# Patient Record
Sex: Male | Born: 1960 | Race: White | Hispanic: No | Marital: Married
Health system: Southern US, Community
[De-identification: ages and names within clinical notes are randomized; demographics above are authoritative.]

---

## 2005-12-19 ENCOUNTER — Ambulatory Visit: Payer: Self-pay | Admitting: Podiatry

## 2012-08-07 ENCOUNTER — Ambulatory Visit
Admission: RE | Admit: 2012-08-07 | Discharge: 2012-08-07 | Disposition: A | Discharge status: Home / Self Care | Payer: BC Managed Care – PPO | Source: Ambulatory Visit | Attending: Chiropractic Medicine | Admitting: Chiropractic Medicine

## 2012-08-07 ENCOUNTER — Other Ambulatory Visit: Payer: Self-pay | Admitting: Chiropractic Medicine

## 2012-09-25 ENCOUNTER — Ambulatory Visit: Payer: Self-pay | Admitting: Orthopaedic Surgery

## 2012-10-11 ENCOUNTER — Ambulatory Visit: Payer: Self-pay | Admitting: Anesthesiology

## 2012-10-24 ENCOUNTER — Ambulatory Visit: Payer: Self-pay | Admitting: Anesthesiology

## 2012-10-31 ENCOUNTER — Ambulatory Visit: Payer: Self-pay | Admitting: Anesthesiology

## 2013-04-14 ENCOUNTER — Ambulatory Visit: Payer: Self-pay | Admitting: Unknown Physician Specialty

## 2014-09-11 NOTE — H&P (Signed)
PATIENT NAME:  Dan Duran, Dan Duran MR#:  [id number] DATE OF BIRTH:  05-14-1961  DATE OF ADMISSION:  10/11/2012  DATE OF DICTATION: 10/11/2012.   CHIEF COMPLAINT: Left-sided neck pain with radiation into the left thumb and index finger.   PROCEDURE: None.   HISTORY OF PRESENT ILLNESS: The patient is a pleasant, 54 year old, white male with a long-standing history of neck pain and left shoulder and arm pain. This has been present for greater than 2 months and has gradually intensified to the point where it is a maximum VAS of seven, best a 0, and averaging between a 4 to 5. Seems to be worse in the morning when he initially gets up from bed and does bother him with certain activities. Prolonged sitting seems to aggravate his pain. Alleviating factors include some physical therapy, which he has just initiated, previous chiropractic manipulation although short lived as well as medication management. Currently, he is taking Advil for breakthrough pain typically taking 3 tablets at 200 mg strength on a t.i.d. basis p.r.n. He has associated tingling that radiates into the left thumb and index finger and the pain does not let him sleep well at night. He describes it as aching, agonizing and annoying with associated tingling. A previous MRI scan was taken and did show evidence of  cervical degenerative disk disease most notably at C5-C6 with mild to moderate posterior disk bulge that caused defacement of the CSF space with severe left and moderate right neural foraminal narrowing. At C6-C7, there is a mild posterior bulge with some mild effacement and this was read by Dr. Bunnie Philips, dated 05/07.   PAST MEDICAL HISTORY: Significant for negative cardiac, negative pulmonary, negative neurologic other than previously mentioned, negative psychologic, negative GI, negative GU, negative hematologic.   SOCIAL HISTORY: He is married with two children. Has never smoked. Works full-time at [employer].    ALLERGIES:  None.  CURRENT MEDICATIONS:  Include p.r.n. Advil.  PAST SURGICAL HISTORY: Tonsillectomy.   PHYSICAL EXAMINATION:  Reveals a pleasant white male in no acute distress. Alert and oriented x 3, cooperative and compliant.   HEENT:  Noncontributory with pupils equally round and reactive to light. Extraocular muscles are intact.   HEART:  Regular rate and rhythm.   LUNGS: Clear to auscultation bilaterally.   VITAL SIGNS:  Blood pressure 122/71, pulse 63, respirations 15, oxygen saturation 96%. Visual analog scale 7/10. With the patient in the standing position, he has got good range of motion at the Atlanto-occipital joint with forward flexion and extension without significant difficulty. Lateral rotation is intact with compression over the head. This does not reproduce his pain. He is able to extend at the neck without immediate reproduction of pain, though 15 seconds later, he did have exacerbation of his left thumb and index finger sensation. His strength appears to be intact to both proximal and distal upper extremity function.  Biceps, triceps, appear to be intact. I would rate his biceps strength as 5-/5 as compared to the right side. Hand grasp and lumbrical strength appears to be intact as does flexors and extensors at the wrist.   ASSESSMENT: 1. C6 radiculitis, left hand with cervicalgia.  2. Myofascial neck pain. 3. Also noted on examination, there was a small trigger point in the left splenius capitis muscle.   PLAN:  Before proceeding with interventional therapy, I would like the patient to continue with his physical therapy regimen for another 2 weeks and in the meantime, I am going to try him  on a steroid Dosepak. I will have him return to the clinic in approximately 2 weeks for possible cervical epidural steroid injection at that time. We talked about the risks, benefits of the procedure in full detail and all questions are  answered.  ____________________________ Currie ParisJames G. Pernell DupreAdams, MD jga:rw D: 10/16/2012 17:22:00 ET T: 10/16/2012 19:20:01 ET JOB#: [id number]  cc: TED ARMOUR, Faythe [place], [place] Currie ParisJames G. Pernell DupreAdams, MD, <Dictator>  Currie ParisJAMES G ADAMS MD ELECTRONICALLY SIGNED 10/22/2012 21:35

## 2016-04-11 ENCOUNTER — Ambulatory Visit: Payer: BLUE CROSS/BLUE SHIELD | Attending: Orthopedic Surgery | Admitting: Occupational Therapy

## 2016-04-11 DIAGNOSIS — M6281 Muscle weakness (generalized): Secondary | ICD-10-CM | POA: Diagnosis present

## 2016-04-11 DIAGNOSIS — M79644 Pain in right finger(s): Secondary | ICD-10-CM | POA: Diagnosis present

## 2016-04-11 DIAGNOSIS — R600 Localized edema: Secondary | ICD-10-CM | POA: Diagnosis present

## 2016-04-11 DIAGNOSIS — M25641 Stiffness of right hand, not elsewhere classified: Secondary | ICD-10-CM

## 2016-04-11 NOTE — Patient Instructions (Signed)
Heat  Blocked AROM for DIP   tendon glides   block intrinsic Full fist - pencil in palm to block MC at 90 - and align 5th from not crossing over  Ice at needed   compression sleeve night and day - breath several times during day

## 2016-04-11 NOTE — Therapy (Signed)
[facility] [address] [place], [place], [address] Phone: [phone]   Fax:  [phone]  Occupational Therapy Evaluation  Patient Details  Name: Dan DawsonRobert B Angelini MRN: [id number] Date of Birth: 04/09/1961 Referring Provider: Martha ClanKrasinski  Encounter Date: 04/11/2016      OT End of Session - 04/11/16 1010    Visit Number 1   Number of Visits 6   Date for OT Re-Evaluation 05/23/16   OT Start Time 0855   OT Stop Time 0940   OT Time Calculation (min) 45 min   Activity Tolerance Patient tolerated treatment well   Behavior During Therapy [facility] for tasks assessed/performed      No past medical history on file.  No past surgical history on file.  There were no vitals filed for this visit.      Subjective Assessment - 04/11/16 1004    Subjective  I fell and dislocated my finger on 10/21 while [place] - reduced it and then it come out 2 more times and the next am - seen Dr Kirtland BouchardK - and wearing buddy strap but swelling still bad and not able to bend at the middle joint   Patient Stated Goals Want to get the use of my pinkie back so I can grip , hold , pick up , gym , write , use computer better    Currently in Pain? No/denies           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[employer] , R hand dominant - likes to gym , hike , mountain sports, yard work , some cooking      Edema   Edema proximal phalanges increase by .7 cm; and PIP 1.3 cm      Right Hand AROM   R Little  MCP 0-90 85 Degrees   R Little PIP 0-100 65 Degrees   R Little DIP 0-70 45 Degrees     Left Hand AROM   L Little DIP 0-70 70 Degrees       fluidotherapy done - and AROM to digits - tendon glides - prior to review of HEP   flexion increase to 80 at  PIP                   OT Education - 04/11/16 1010    Education provided Yes   Education Details HEP and findings discuss    Person(s) Educated Patient   Methods Explanation;Demonstration;Tactile cues;Verbal cues;Handout   Comprehension Verbal cues required;Returned demonstration;Verbalized understanding          OT Short Term Goals - 04/11/16 1014      OT SHORT TERM GOAL #1   Title Edema decrease in R 5th digit by more than 1cm to show increase flexion and of 5th during gripping activities    Baseline PIP flexion 65 , edema more than 1.3 cm    Time 2   Period Weeks   Status New     OT SHORT TERM GOAL #2   Title R 5th AROM improve in PIP and DIP for pt to touch palm during gripping of 1inch objects in ADL's and IADL's    Baseline DIP 45 , PIP 65    Time  4   Period Weeks   Status New           OT Long Term Goals - 04/11/16 1016      OT LONG TERM GOAL #1   Title Grip strength in R hand increase to more than 50% compare to L hand    Baseline Grip NT - 4 1/2 wks out    Time 6   Period Weeks   Status New     OT LONG TERM GOAL #2   Title Flexion and grip in R 5th improve for pt to increase function of R hand  on PRHWE by 10 points    Baseline Pt hard time working out , writing , cutting with knife, gripping - PRWHE function score 12.5/50    Time 6   Period Weeks   Status New               Plan - 04/11/16 1011    Clinical Impression Statement Pt present about 4 1/2 wks out from dislocation of R 5th PIP - pt arrive with 2 budddy straps on - pt show increase edema , pain -  decrease flexion at PIP and DIP - and decrease grip and use of R hand in ADL's and IADL's    Rehab Potential Good   OT Frequency 1x / week   OT Duration 6 weeks   OT Treatment/Interventions Self-care/ADL training;Fluidtherapy;Splinting;Patient/family education;Therapeutic exercises;Ultrasound;Passive range of motion;Manual Therapy;Cryotherapy   Plan assess progress in ROM and  edema    OT Home Exercise Plan see pt instruction    Consulted and Agree with Plan of Care Patient      Patient will benefit from skilled therapeutic intervention in order to improve the following deficits and impairments:  Impaired flexibility, Decreased range of motion, Increased edema, Pain, Impaired UE functional use, Decreased strength, Decreased knowledge of precautions  Visit Diagnosis: Stiffness of right hand, not elsewhere classified - Plan: Ot plan of care cert/re-cert  Pain in right finger(s) - Plan: Ot plan of care cert/re-cert  Localized edema - Plan: Ot plan of care cert/re-cert  Muscle weakness (generalized) - Plan: Ot plan of care cert/re-cert    Problem List There are no active problems to display for this patient.   [name], Asencion Loveday OTR/L,CLT 04/11/2016, 10:23 AM  [facility] [address] [place], [place], [address] Phone: [phone]   Fax:  [phone]  Name: Dan DawsonRobert B Bare MRN: [id number] Date of Birth: 06/23/1960

## 2016-04-18 ENCOUNTER — Ambulatory Visit: Payer: BLUE CROSS/BLUE SHIELD | Admitting: Occupational Therapy

## 2016-04-18 DIAGNOSIS — R600 Localized edema: Secondary | ICD-10-CM

## 2016-04-18 DIAGNOSIS — M25641 Stiffness of right hand, not elsewhere classified: Secondary | ICD-10-CM | POA: Diagnosis not present

## 2016-04-18 DIAGNOSIS — M79644 Pain in right finger(s): Secondary | ICD-10-CM

## 2016-04-18 DIAGNOSIS — M6281 Muscle weakness (generalized): Secondary | ICD-10-CM

## 2016-04-18 NOTE — Therapy (Signed)
[facility] [address] [place], [place], [address] Phone: [phone]   Fax:  [phone]  Occupational Therapy Treatment  Patient Details  Name: Dan DawsonRobert B Duran MRN: [id number] Date of Birth: 10/13/1960 Referring Provider: Martha ClanKrasinski  Encounter Date: 04/18/2016      OT End of Session - 04/18/16 1816    Visit Number 2   Number of Visits 6   Date for OT Re-Evaluation 05/23/16   OT Start Time 1246   OT Stop Time 1325   OT Time Calculation (min) 39 min   Activity Tolerance Patient tolerated treatment well   Behavior During Therapy [facility] for tasks assessed/performed      No past medical history on file.  No past surgical history on file.  There were no vitals filed for this visit.      Subjective Assessment - 04/18/16 1259    Subjective  Just feel like that middle knuckle not bending good - but other 2 better - only time pain when shaking hands or picking up heavy objects - or like when you bended it    Patient Stated Goals Want to get the use of my pinkie back so I can grip , hold , pick up , gym , write , use computer better    Currently in Pain? Yes   Pain Score 1    Pain Location Finger (Comment which one)   Pain Orientation Right   Pain Descriptors / Indicators Aching                      OT Treatments/Exercises (OP) - 04/18/16 0001      RUE Fluidotherapy   Number Minutes Fluidotherapy 10 Minutes   RUE Fluidotherapy Location Hand;Wrist   [facility] ROM at PIP , MP and DIP - squeezing foam block at end       Come in pt about 65 degrees at PIP  [facility] Ed[facility] decrease to about 0.6 at PIP   After fluido  PROM to DIP , PIP 10 reps each Intrinsic stretch to DIP /PIP flexion  Hold 30 sec x 3  PROM composite flexion  Blocked AROM intrinsic fist Full fist place and hold - touching palm  Needed mod A and mod v/c Flexion at PIP improve to 85 during session    Fitted with new compression silicon sleeve to wear most all the time  Buddy strap on during day on and off              OT Education - 04/18/16 1816    Education provided Yes   Education Details add PROM to HEP - updated    Person(s) Educated Patient   Methods Explanation;Demonstration;Tactile cues;Verbal cues;Handout   Comprehension Verbal cues required;Returned demonstration;Verbalized understanding          OT Short Term Goals - 04/11/16 1014      OT SHORT TERM GOAL #1   Title Edema decrease in R 5th digit by more than 1cm to show increase flexion and of 5th during gripping activities    Baseline PIP flexion 65 , edema more than 1.3 cm    Time 2   Period Weeks   Status New     OT SHORT TERM GOAL #2   Title R 5th AROM improve in PIP and DIP for pt to touch palm during gripping of 1inch objects in ADL's and IADL's    Baseline DIP 45 ,  PIP 65    Time 4   Period Weeks   Status New           OT Long Term Goals - 04/11/16 1016      OT LONG TERM GOAL #1   Title Grip strength in R hand increase to more than 50% compare to L hand    Baseline Grip NT - 4 1/2 wks out    Time 6   Period Weeks   Status New     OT LONG TERM GOAL #2   Title Flexion and grip in R 5th improve for pt to increase function of R hand  on PRHWE by 10 points    Baseline Pt hard time working out , writing , cutting with knife, gripping - PRWHE function score 12.5/50    Time 6   Period Weeks   Status New               Plan - 04/18/16 1817    Clinical Impression Statement Pt showe great progress at PIP flexion during session - did add to HEP PROM this date - pt to do HEP for week - if have cancellation would bring pt in - but can do strengthening next week    Rehab Potential Good   OT Frequency 1x / week   OT Duration 4 weeks   OT Treatment/Interventions Self-care/ADL training;Fluidtherapy;Splinting;Patient/family education;Therapeutic exercises;Ultrasound;Passive range of  motion;Manual Therapy;Cryotherapy   Plan assess progress with PROM during HEP    OT Home Exercise Plan see pt instruction    Consulted and Agree with Plan of Care Patient      Patient will benefit from skilled therapeutic intervention in order to improve the following deficits and impairments:  Impaired flexibility, Decreased range of motion, Increased edema, Pain, Impaired UE functional use, Decreased strength, Decreased knowledge of precautions  Visit Diagnosis: Stiffness of right hand, not elsewhere classified  Pain in right finger(s)  Localized edema  Muscle weakness (generalized)    Problem List There are no active problems to display for this patient.   [name], Malynda Smolinski OTR/L,CLT 04/18/2016, 6:18 PM  [place] [facility] [address] , [place], [address] Phone: [phone]   Fax:  [phone]  Name: Dan DawsonRobert B Duran MRN: [id number] Date of Birth: 06/13/1960

## 2016-04-18 NOTE — Patient Instructions (Signed)
Heat   upgrade HEP to  PROM to DIP , PIP 10 reps each Intrinsic stretch to DIP /PIP flexion  Hold 30 sec x 3  PROM composite flexion  Blocked AROM intrinsic fist Full fist place and hold - touching palm  Needed mod A and mod v/c  Fitted with new compression silicon sleeve to wear most all the time  Buddy strap on during day on and off

## 2016-04-24 ENCOUNTER — Ambulatory Visit: Payer: BLUE CROSS/BLUE SHIELD | Attending: Orthopedic Surgery | Admitting: Occupational Therapy

## 2016-04-24 DIAGNOSIS — R600 Localized edema: Secondary | ICD-10-CM | POA: Insufficient documentation

## 2016-04-24 DIAGNOSIS — M6281 Muscle weakness (generalized): Secondary | ICD-10-CM

## 2016-04-24 DIAGNOSIS — M79644 Pain in right finger(s): Secondary | ICD-10-CM | POA: Diagnosis present

## 2016-04-24 DIAGNOSIS — M25641 Stiffness of right hand, not elsewhere classified: Secondary | ICD-10-CM | POA: Insufficient documentation

## 2016-04-24 NOTE — Patient Instructions (Addendum)
  Same ROM HEP  Add teal putty for gripping and pinch into small ball with 5th  12 reps each  1-2 x day this week   Silicon compression sleeve only for night time and maybe if needed middle of day for 2-3 hrs  Leave buddy strap off and use 5th full fist -

## 2016-04-24 NOTE — Therapy (Signed)
[facility] [address] [place], [place], [address] Phone: [phone]   Fax:  [phone]  Occupational Therapy Treatment  Patient Details  Name: Dan DawsonRobert B Duran MRN: [id number] Date of Birth: 04/11/1961 Referring Provider: Martha ClanKrasinski  Encounter Date: 04/24/2016      OT End of Session - 04/24/16 1400    Visit Number 3   Number of Visits 6   Date for OT Re-Evaluation 05/23/16   OT Start Time 0953   OT Stop Time 1028   OT Time Calculation (min) 35 min   Activity Tolerance Patient tolerated treatment well   Behavior During Therapy [facility] for tasks assessed/performed      No past medical history on file.  No past surgical history on file.  There were no vitals filed for this visit.      Subjective Assessment - 04/24/16 1353    Subjective  My pinkie bending better and able to use it more - the tip joint  of pinkie hurting more than the one that I dislocated - wearing digisleeve and buddy strap about 50% of time during day    Patient Stated Goals Want to get the use of my pinkie back so I can grip , hold , pick up , gym , write , use computer better    Currently in Pain? No/denies                      OT Treatments/Exercises (OP) - 04/24/16 0001      RUE Paraffin   Number Minutes Paraffin 7 Minutes   RUE Paraffin Location Hand   [facility] ROM at PIP - pt kept in intrinsic fist for 7 min with heatinpad around       After paraffin  Soft tissue massage on lateral bands of PIP prior to ROM  Prolonged flexion stretch 3 x 15 sec on PIP flexion  PROM to  PIP 10 reps each Intrinsic stretch to DIP /PIP flexion  Hold 20 sec x 3  PROM composite flexion  - 10 reps Blocked AROM intrinsic fist Full fist place and hold - touching palm   Flexion at PIP improve to 90  during session  And PROM 95 Add teal putty for gripping and pinch into small ball with 5th  12 reps each  1-2 x day  this week   Silicon compression sleeve only for night time and maybe if needed middle of day for 2-3 hrs  Leave buddy strap off and use 5th full fist -            OT Education - 04/24/16 1358    Education provided Yes   Education Details HEP    Person(s) Educated Patient   Methods Explanation;Demonstration;Tactile cues;Verbal cues;Handout   Comprehension Verbal cues required;Returned demonstration;Verbalized understanding          OT Short Term Goals - 04/11/16 1014      OT SHORT TERM GOAL #1   Title Edema decrease in R 5th digit by more than 1cm to show increase flexion and of 5th during gripping activities    Baseline PIP flexion 65 , edema more than 1.3 cm    Time 2   Period Weeks   Status New     OT SHORT TERM GOAL #2   Title R 5th AROM improve in PIP and DIP for pt to touch palm during gripping of 1inch objects in ADL's and IADL's  Baseline DIP 45 , PIP 65    Time 4   Period Weeks   Status New           OT Long Term Goals - 04/11/16 1016      OT LONG TERM GOAL #1   Title Grip strength in R hand increase to more than 50% compare to L hand    Baseline Grip NT - 4 1/2 wks out    Time 6   Period Weeks   Status New     OT LONG TERM GOAL #2   Title Flexion and grip in R 5th improve for pt to increase function of R hand  on PRHWE by 10 points    Baseline Pt hard time working out , writing , cutting with knife, gripping - PRWHE function score 12.5/50    Time 6   Period Weeks   Status New               Plan - 04/24/16 1400    Clinical Impression Statement Pt show great progress increase since last time in flexion at PIP - still some edema more than 0.5 at PIP - pt to remove buddy strap and use pinkie in full range - work towards 90 degrees of PIP flexion - add some putty for strengthening    Rehab Potential Good   OT Frequency 1x / week   OT Duration 4 weeks   OT Treatment/Interventions Self-care/ADL  training;Fluidtherapy;Splinting;Patient/family education;Therapeutic exercises;Ultrasound;Passive range of motion;Manual Therapy;Cryotherapy   Plan assess progress -grip and prehension strength    OT Home Exercise Plan see pt instruction    Consulted and Agree with Plan of Care Patient      Patient will benefit from skilled therapeutic intervention in order to improve the following deficits and impairments:  Impaired flexibility, Decreased range of motion, Increased edema, Pain, Impaired UE functional use, Decreased strength, Decreased knowledge of precautions  Visit Diagnosis: Stiffness of right hand, not elsewhere classified  Pain in right finger(s)  Localized edema  Muscle weakness (generalized)    Problem List There are no active problems to display for this patient.   Dan Duran, Barbarita Hutmacher OTR/L,CLT 04/24/2016, 2:04 PM  [facility] [address] [place], [place], [address] Phone: [phone]   Fax:  [phone]  Name: Dan DawsonRobert B Rossitto MRN: [id number] Date of Birth: 11/16/1960

## 2016-05-01 ENCOUNTER — Ambulatory Visit: Payer: BLUE CROSS/BLUE SHIELD | Admitting: Occupational Therapy

## 2016-05-01 DIAGNOSIS — R600 Localized edema: Secondary | ICD-10-CM

## 2016-05-01 DIAGNOSIS — M79644 Pain in right finger(s): Secondary | ICD-10-CM

## 2016-05-01 DIAGNOSIS — M25641 Stiffness of right hand, not elsewhere classified: Secondary | ICD-10-CM

## 2016-05-01 DIAGNOSIS — M6281 Muscle weakness (generalized): Secondary | ICD-10-CM

## 2016-05-01 NOTE — Patient Instructions (Addendum)
Upgrade to green putty for gripping and pinch into small ball with 5th  Needed min A - to stop when feeling pull - and hold putty in palm and pinch into it - no around  Add pulling with all digits - lumbrical grip 12 reps each  1-2 x day this week - but should not have increase pain or edema   Silicon compression sleeve  Again on during day - but on and off 2-3 hrs - had more than 1 cm edema in PIP again  And on at night time still  And hold off on PROM and stretches while increase edema - and soreness 3/10 with active composite fist

## 2016-05-01 NOTE — Therapy (Signed)
[facility] [address] [place], [place], [address] Phone: [phone]   Fax:  [phone]  Occupational Therapy Treatment  Patient Details  Name: Dan DawsonRobert B Duran MRN: [id number] Date of Birth: 12/18/1960 Referring Provider: Martha ClanKrasinski  Encounter Date: 05/01/2016      OT End of Session - 05/01/16 0931    Visit Number 4   Date for OT Re-Evaluation 05/23/16   OT Start Time 0853   OT Stop Time 0927   OT Time Calculation (min) 34 min   Activity Tolerance Patient tolerated treatment well   Behavior During Therapy [facility] for tasks assessed/performed      No past medical history on file.  No past surgical history on file.  There were no vitals filed for this visit.      Subjective Assessment - 05/01/16 0853    Subjective  Doing okay - little more sore and maybe swollen - don't know if I over did the putty - it is not crossing over at much anymore   Patient Stated Goals Want to get the use of my pinkie back so I can grip , hold , pick up , gym , write , use computer better    Currently in Pain? Yes   Pain Score 3    Pain Location Finger (Comment which one)   Pain Orientation Right   Pain Descriptors / Indicators Aching   Aggravating Factors  when making tight fist            [facility] OT Assessment - 05/01/16 0001      Strength   Right Hand Grip (lbs) 83   Left Hand Grip (lbs) 80                  OT Treatments/Exercises (OP) - 05/01/16 0001      RUE Fluidotherapy   Number Minutes Fluidotherapy 8 Minutes   RUE Fluidotherapy Location Hand   [facility] and decrease stiffness      Soft tissue massage on lateral bands of PIP prior to ROM  Blocked AROM intrinsic fist Full fist AROM  touching palm  Arrive with 85 PIP flexion  MC 95, PIP 95 end of session   Upgrade to green putty for gripping and pinch into small ball with 5th  Needed min A - to stop when  feeling pull - and hold putty in palm and pinch into it - no around  Add pulling with all digits - lumbrical grip 12 reps each  1-2 x day this week - but should not have increase pain or edema   Silicon compression sleeve  Again on during day - but on and off 2-3 hrs - had more than 1 cm edema in PIP again  And on at night time still  And hold off on PROM and stretches while increase edema - and soreness 3/10 with active composite fist              OT Education - 05/01/16 0930    Education provided Yes   Education Details HEP changes   Person(s) Educated Patient   Methods Explanation;Demonstration;Tactile cues;Verbal cues;Handout   Comprehension Verbal cues required;Returned demonstration;Verbalized understanding          OT Short Term Goals - 04/11/16 1014      OT SHORT TERM GOAL #1   Title Edema decrease in R 5th digit by more than 1cm to show increase flexion and  of 5th during gripping activities    Baseline PIP flexion 65 , edema more than 1.3 cm    Time 2   Period Weeks   Status New     OT SHORT TERM GOAL #2   Title R 5th AROM improve in PIP and DIP for pt to touch palm during gripping of 1inch objects in ADL's and IADL's    Baseline DIP 45 , PIP 65    Time 4   Period Weeks   Status New           OT Long Term Goals - 04/11/16 1016      OT LONG TERM GOAL #1   Title Grip strength in R hand increase to more than 50% compare to L hand    Baseline Grip NT - 4 1/2 wks out    Time 6   Period Weeks   Status New     OT LONG TERM GOAL #2   Title Flexion and grip in R 5th improve for pt to increase function of R hand  on PRHWE by 10 points    Baseline Pt hard time working out , writing , cutting with knife, gripping - PRWHE function score 12.5/50    Time 6   Period Weeks   Status New               Plan - 05/01/16 0931    Clinical Impression Statement Pt making great progress in AROM at PIP - but had increase edema this week again to over 1 cm at  PIP and some soreness 3/10 with composite fist - pt to hold off on PROM and stretches and wear compression during day some - and focus on AROM and putty - but not over do putty    Rehab Potential Good   OT Frequency 1x / week   OT Duration 2 weeks   OT Treatment/Interventions Self-care/ADL training;Fluidtherapy;Splinting;Patient/family education;Therapeutic exercises;Ultrasound;Passive range of motion;Manual Therapy;Cryotherapy   Plan assess edema , pain - progress in ROM and grip   OT Home Exercise Plan see pt instruction    Consulted and Agree with Plan of Care Patient      Patient will benefit from skilled therapeutic intervention in order to improve the following deficits and impairments:  Impaired flexibility, Decreased range of motion, Increased edema, Pain, Impaired UE functional use, Decreased strength, Decreased knowledge of precautions  Visit Diagnosis: Stiffness of right hand, not elsewhere classified  Pain in right finger(s)  Localized edema  Muscle weakness (generalized)    Problem List There are no active problems to display for this patient.   [name], Senaya Dicenso OTR/L,CLT 05/01/2016, 10:38 AM  [facility] [address] [place], [place], [id number] Phone: [phone]   Fax:  [phone]  Name: Dan DawsonRobert B Duran MRN: [id number] Date of Birth: 02/23/1961

## 2016-05-10 ENCOUNTER — Ambulatory Visit: Payer: BLUE CROSS/BLUE SHIELD | Admitting: Occupational Therapy

## 2016-05-10 DIAGNOSIS — M6281 Muscle weakness (generalized): Secondary | ICD-10-CM

## 2016-05-10 DIAGNOSIS — M25641 Stiffness of right hand, not elsewhere classified: Secondary | ICD-10-CM | POA: Diagnosis not present

## 2016-05-10 DIAGNOSIS — M79644 Pain in right finger(s): Secondary | ICD-10-CM

## 2016-05-10 DIAGNOSIS — R600 Localized edema: Secondary | ICD-10-CM

## 2016-05-10 NOTE — Patient Instructions (Signed)
Pt to cont with AROM  Pt to cont with  green putty for gripping and pinch into small ball with 5th  pulling with all digits - lumbrical grip 12 reps each  1-2 x day this week - but should not have increase pain or edema   Silicon compression sleeve  Again on during day as needed  And night time   And hold off on PROM and stretches while increase edema

## 2016-05-10 NOTE — Therapy (Signed)
[place] [facility]ORTS MEDICINE [address] [place], [address] Phone: [phone]   Fax:  [phone]  Occupational Therapy Treatment/discharge  Patient Details  Name: PHOENYX PAULSEN MRN: [id number] Date of Birth: 1961-05-12 Referring Provider: Mack Guise  Encounter Date: 05/10/2016      OT End of Session - 05/10/16 0858    Visit Number 5   Number of Visits 5   Date for OT Re-Evaluation 05/10/16   OT Start Time 0809   OT Stop Time 0833   OT Time Calculation (min) 24 min   Activity Tolerance Patient tolerated treatment well   Behavior During Therapy [facility] for tasks assessed/performed      No past medical history on file.  No past surgical history on file.  There were no vitals filed for this visit.      Subjective Assessment - 05/10/16 0851    Subjective  Pain much better - swelling better - using it now in everything - still wearing compression sleeve at night time and as needed during day    Patient Stated Goals Want to get the use of my pinkie back so I can grip , hold , pick up , gym , write , use computer better    Currently in Pain? Yes   Pain Score 1    Pain Location Finger (Comment which one)   Pain Orientation Right   Pain Descriptors / Indicators Aching      Measured ROM and grip/prehension  Blocked AROM intrinsic fist Full fist AROM  touching palm  Arrive with 90-95 PIP flexion  MC 95  Pt to cont with  green putty for gripping and pinch into small ball with 5th  pulling with all digits - lumbrical grip 12 reps each  1-2 x day this week - but should not have increase pain or edema   Silicon compression sleeve  Again on during day as needed  And night time   And hold off on PROM and stretches while increase edema   PRWHE done for pain 5/50; function 0/50                             OT Education - 05/10/16 0858    Education provided Yes   Education Details discharge  instructions   Person(s) Educated Patient   Methods Explanation;Demonstration;Tactile cues;Verbal cues   Comprehension Verbal cues required;Returned demonstration;Verbalized understanding          OT Short Term Goals - 05/10/16 0900      OT SHORT TERM GOAL #1   Title Edema decrease in R 5th digit by more than 1cm to show increase flexion and of 5th during gripping activities    Baseline PIP 95, edema less than 1 cm    Status Achieved     OT SHORT TERM GOAL #2   Title R 5th AROM improve in PIP and DIP for pt to touch palm during gripping of 1inch objects in ADL's and IADL's    Status Achieved           OT Long Term Goals - 05/10/16 0900      OT LONG TERM GOAL #1   Title Grip strength in R hand increase to more than 50% compare to L hand    Baseline Grip R 90, L 80   Status Achieved     OT LONG TERM GOAL #2   Title Flexion and grip in  R 5th improve for pt to increase function of R hand  on PRHWE by 10 points    Baseline NO issues using hand - Flexion MC 90, PIP 95, DIP 70   Status Achieved               Plan - 05/10/16 0859    Clinical Impression Statement Pt made great progress in edema , pain , ROM , strength and functional use - pt to cont with HEP for AROM , strengthening and edema control  - met all goals discharge from OT services   OT Treatment/Interventions Self-care/ADL training;Fluidtherapy;Splinting;Patient/family education;Therapeutic exercises;Ultrasound;Passive range of motion;Manual Therapy;Cryotherapy   Plan discharge with HEP   OT Home Exercise Plan see pt instruction    Consulted and Agree with Plan of Care Patient      Patient will benefit from skilled therapeutic intervention in order to improve the following deficits and impairments:     Visit Diagnosis: Stiffness of right hand, not elsewhere classified  Pain in right finger(s)  Localized edema  Muscle weakness (generalized)    Problem List There are no active problems to display  for this patient.   Rosalyn Gess OTR/L,CLT 05/10/2016, 9:02 AM  [place] PH[facility] AND SPORTS MEDICINE [address] [place], [address] Phone: [phone]   Fax:  [phone]  Name: JOSTEN WARMUTH MRN: [id number] Date of Birth: 21-Dec-1960

## 2017-10-01 ENCOUNTER — Other Ambulatory Visit: Payer: Self-pay | Admitting: Podiatry

## 2017-10-01 DIAGNOSIS — S86312A Strain of muscle(s) and tendon(s) of peroneal muscle group at lower leg level, left leg, initial encounter: Secondary | ICD-10-CM

## 2017-10-04 ENCOUNTER — Ambulatory Visit
Admission: RE | Admit: 2017-10-04 | Discharge: 2017-10-04 | Disposition: A | Discharge status: Home / Self Care | Payer: BLUE CROSS/BLUE SHIELD | Source: Ambulatory Visit | Attending: Podiatry | Admitting: Podiatry

## 2017-10-04 ENCOUNTER — Encounter (INDEPENDENT_AMBULATORY_CARE_PROVIDER_SITE_OTHER): Payer: Self-pay

## 2017-10-04 DIAGNOSIS — X58XXXA Exposure to other specified factors, initial encounter: Secondary | ICD-10-CM | POA: Insufficient documentation

## 2017-10-04 DIAGNOSIS — M65872 Other synovitis and tenosynovitis, left ankle and foot: Secondary | ICD-10-CM | POA: Insufficient documentation

## 2017-10-04 DIAGNOSIS — S86312A Strain of muscle(s) and tendon(s) of peroneal muscle group at lower leg level, left leg, initial encounter: Secondary | ICD-10-CM | POA: Insufficient documentation

## 2019-03-31 ENCOUNTER — Other Ambulatory Visit: Payer: Self-pay

## 2019-03-31 DIAGNOSIS — Z20822 Contact with and (suspected) exposure to covid-19: Secondary | ICD-10-CM

## 2019-04-01 LAB — NOVEL CORONAVIRUS, NAA: SARS-CoV-2, NAA: NOT DETECTED

## 2020-11-17 ENCOUNTER — Ambulatory Visit
Admission: RE | Admit: 2020-11-17 | Discharge: 2020-11-17 | Disposition: A | Discharge status: Home / Self Care | Payer: BLUE CROSS/BLUE SHIELD | Source: Ambulatory Visit | Attending: Chiropractic Medicine | Admitting: Chiropractic Medicine

## 2020-11-17 ENCOUNTER — Other Ambulatory Visit: Payer: Self-pay | Admitting: Chiropractic Medicine

## 2020-11-17 DIAGNOSIS — R609 Edema, unspecified: Secondary | ICD-10-CM

## 2021-07-26 ENCOUNTER — Other Ambulatory Visit: Payer: Self-pay | Admitting: Physician Assistant

## 2021-07-26 ENCOUNTER — Ambulatory Visit
Admission: RE | Admit: 2021-07-26 | Discharge: 2021-07-26 | Disposition: A | Discharge status: Home / Self Care | Payer: BC Managed Care – PPO | Source: Ambulatory Visit | Attending: Physician Assistant | Admitting: Physician Assistant

## 2021-07-26 ENCOUNTER — Other Ambulatory Visit: Payer: Self-pay

## 2021-07-26 DIAGNOSIS — R1032 Left lower quadrant pain: Secondary | ICD-10-CM | POA: Diagnosis not present

## 2021-07-26 MED ORDER — IOHEXOL 300 MG/ML  SOLN
100.0000 mL | Freq: Once | INTRAMUSCULAR | Status: AC | PRN
  Administered 2021-07-26: 100 mL via INTRAVENOUS

## 2022-09-29 ENCOUNTER — Other Ambulatory Visit: Payer: Self-pay | Admitting: Internal Medicine

## 2022-09-29 DIAGNOSIS — E785 Hyperlipidemia, unspecified: Secondary | ICD-10-CM

## 2022-09-29 DIAGNOSIS — Z0000 Encounter for general adult medical examination without abnormal findings: Secondary | ICD-10-CM

## 2022-10-05 ENCOUNTER — Ambulatory Visit
Admission: RE | Admit: 2022-10-05 | Discharge: 2022-10-05 | Disposition: A | Discharge status: Home / Self Care | Payer: Self-pay | Source: Ambulatory Visit | Attending: Internal Medicine | Admitting: Internal Medicine

## 2022-10-05 DIAGNOSIS — Z0000 Encounter for general adult medical examination without abnormal findings: Secondary | ICD-10-CM | POA: Insufficient documentation

## 2022-10-05 DIAGNOSIS — E785 Hyperlipidemia, unspecified: Secondary | ICD-10-CM | POA: Insufficient documentation

## 2023-04-20 IMAGING — CR DG ANKLE COMPLETE 3+V*R*
3 series · 3 of 3 positions shown · non-contrast
Comparison: None.

CLINICAL DATA: Pain

EXAM:
RIGHT ANKLE - COMPLETE 3+ VIEW

[t ankle joint ap right]
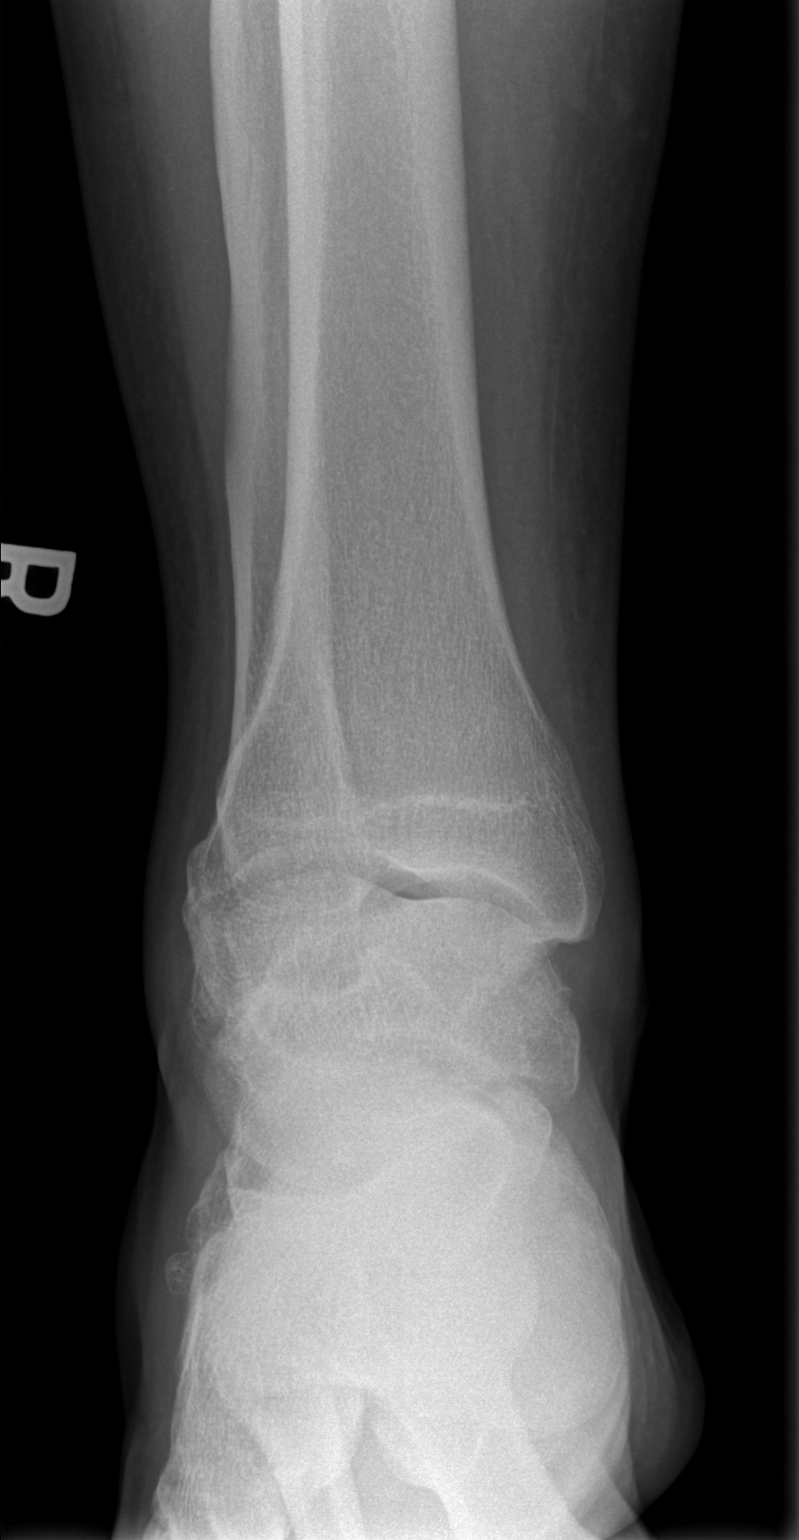

[t ankle joint oblique right]
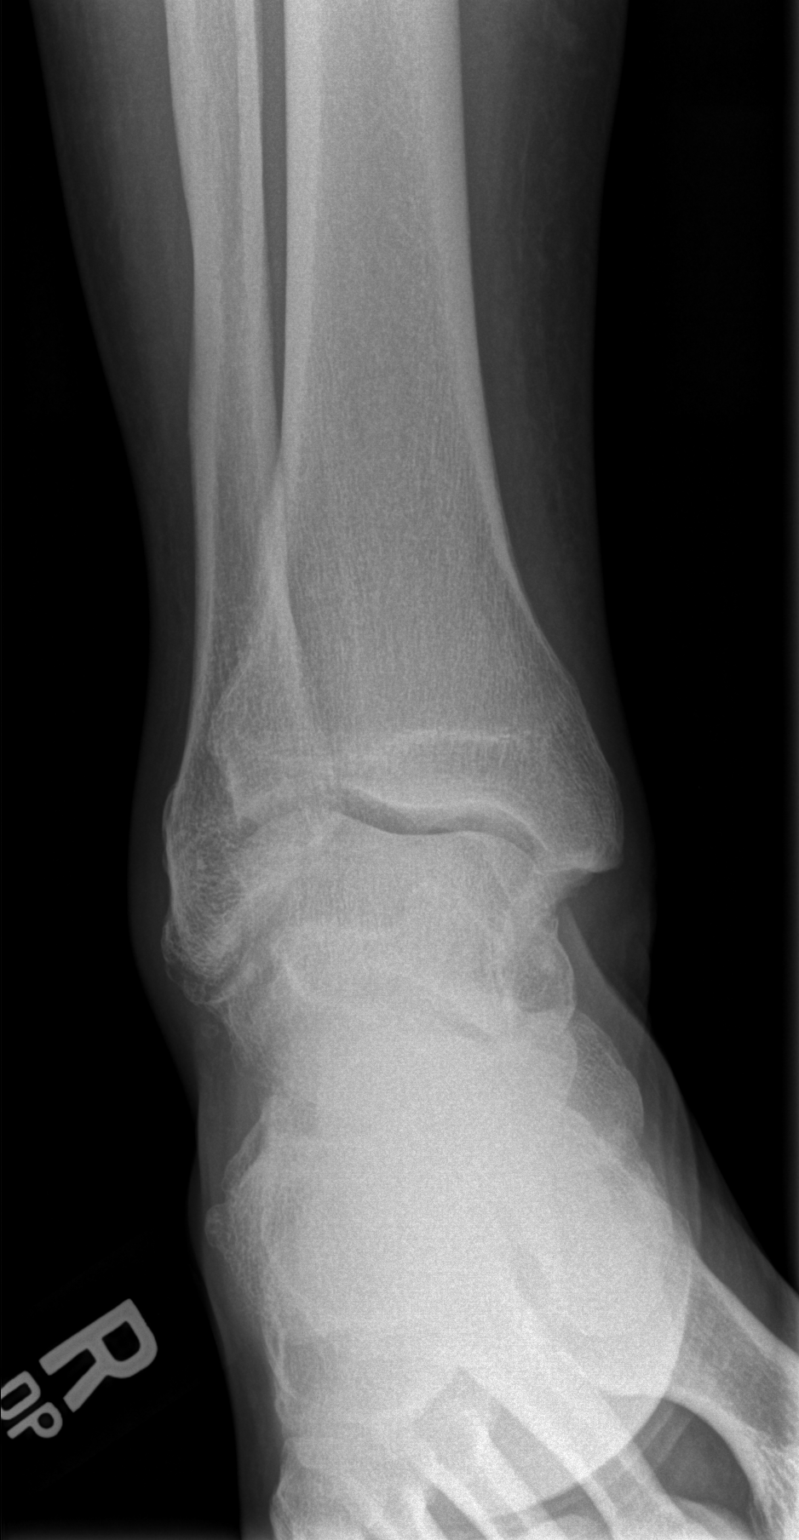

[t ankle joint lat right]
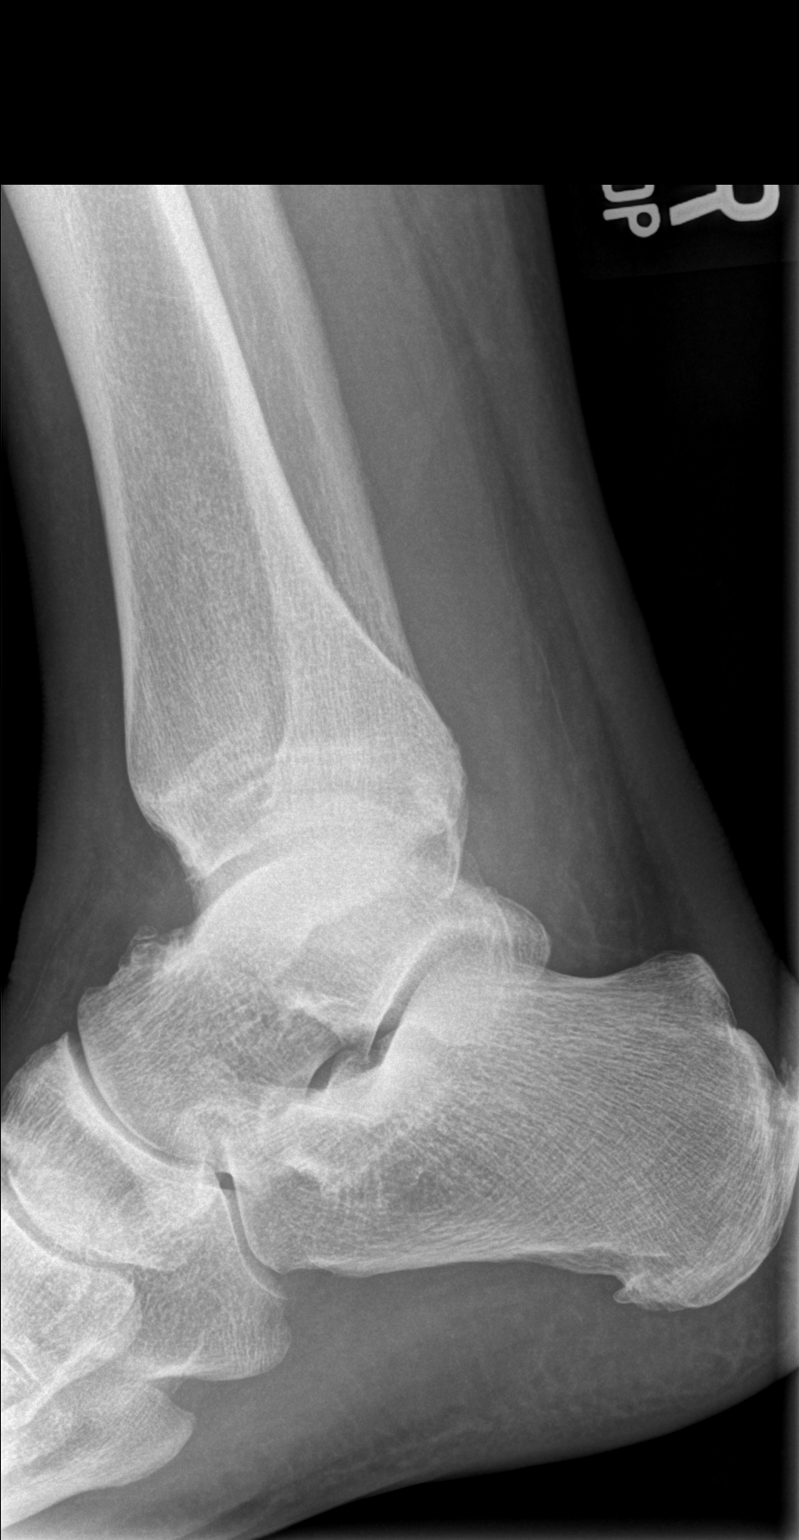

[3 of 3 positions shown; findings below may reference images not displayed]

FINDINGS: No acute fracture or dislocation. Degenerative changes of the talus.
Suboptimal evaluation of the ankle mortise secondary to positioning.
No area of erosion or osseous destruction. No unexpected radiopaque
foreign body. Soft tissues are unremarkable.
IMPRESSION: No acute fracture or dislocation.

## 2023-12-27 IMAGING — CT CT ABD-PELV W/ CM
2 of 5 series · 15 of 46 positions shown, 17 images · IV contrast (APPLIED)
Comparison: None.

CLINICAL DATA: Left lower quadrant pain for past 2 days.

EXAM:
CT ABDOMEN AND PELVIS WITH CONTRAST
TECHNIQUE: Multidetector CT imaging of the abdomen and pelvis was performed
using the standard protocol following bolus administration of
intravenous contrast.

[Series 3: axial st · axial · 0.78mm/px · z∈[-1345,-865]mm · 12 of 108 slices shown, 14 images]
[im 6/108  soft-tissue]
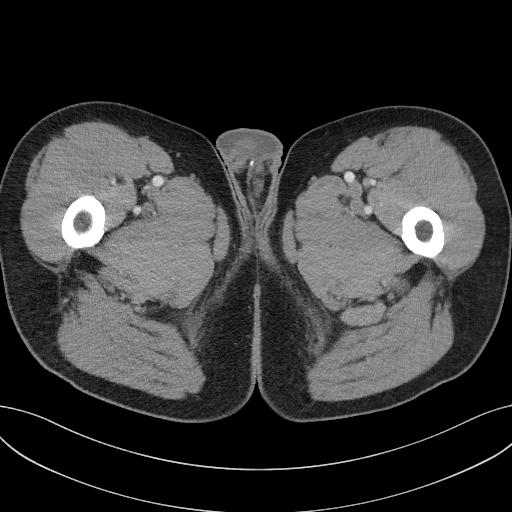
[im 6/108  bone]
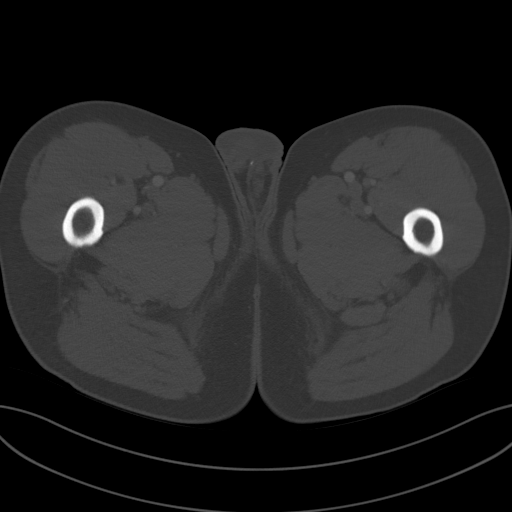
[im 18/108  soft-tissue]
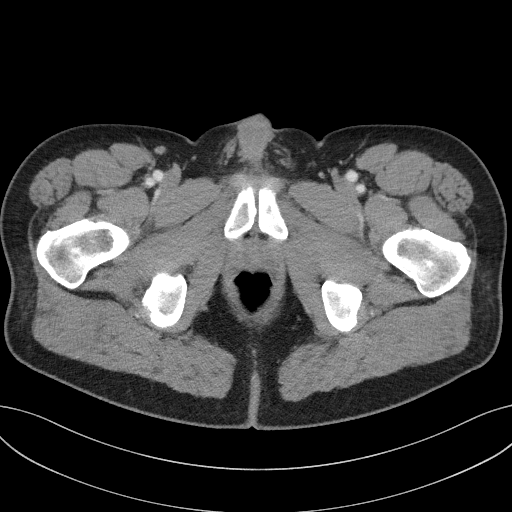
[im 24/108  soft-tissue]
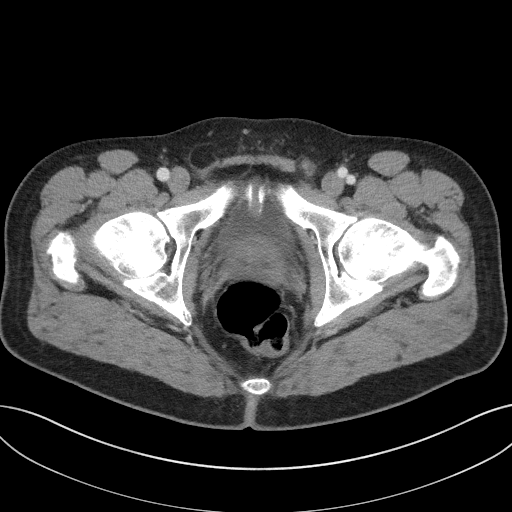
[im 30/108  soft-tissue]
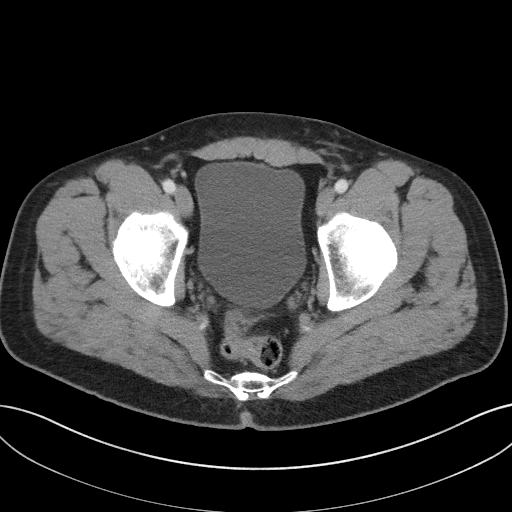
[im 42/108  soft-tissue]
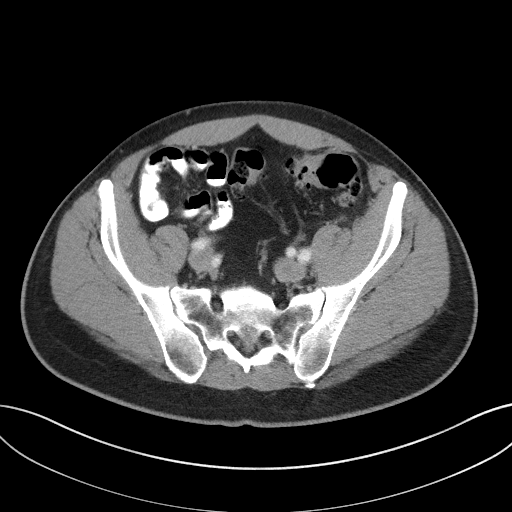
[im 48/108  soft-tissue]
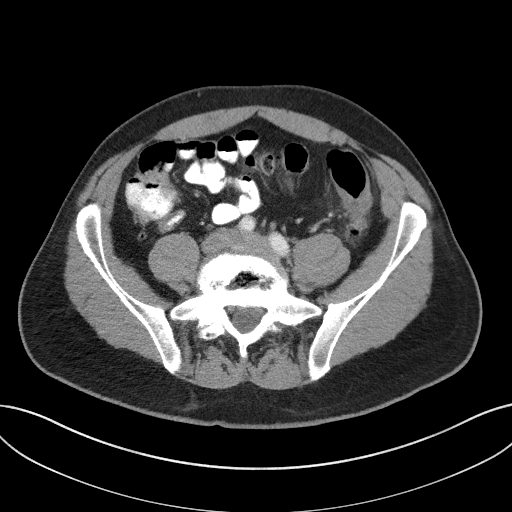
[im 60/108  soft-tissue]
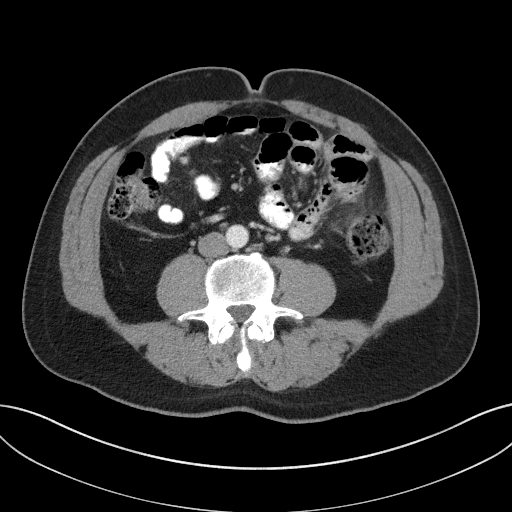
[im 66/108  soft-tissue]
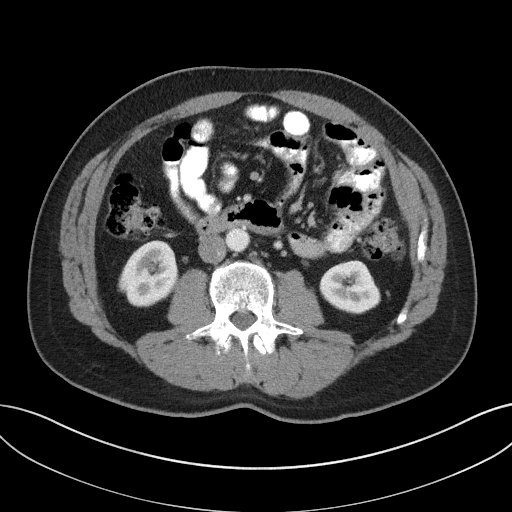
[im 78/108  soft-tissue]
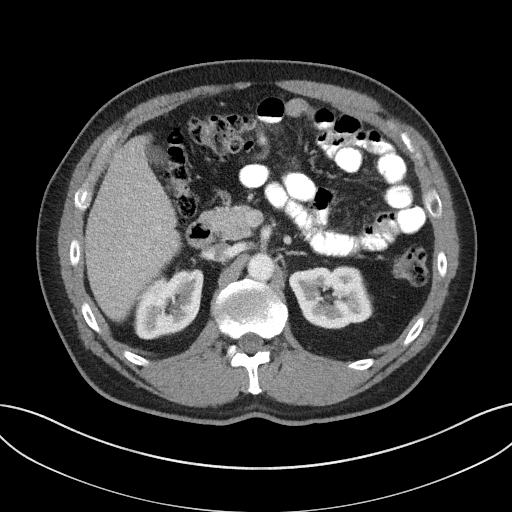
[im 78/108  bone]
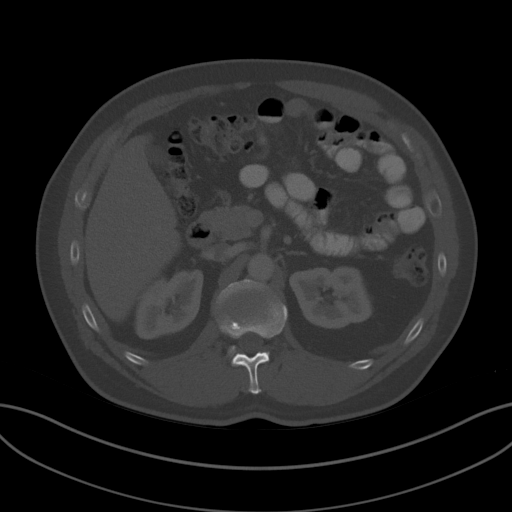
[im 84/108  soft-tissue]
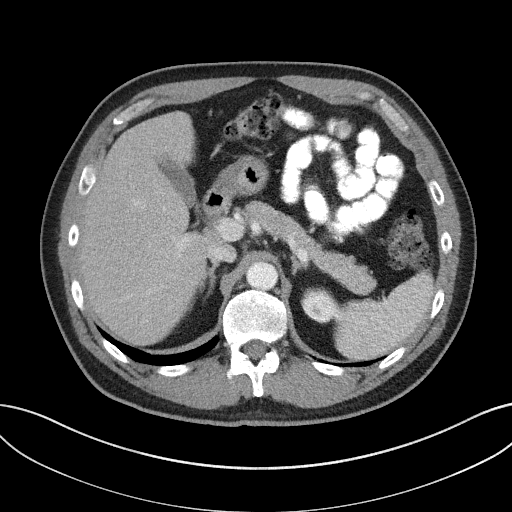
[im 90/108  soft-tissue]
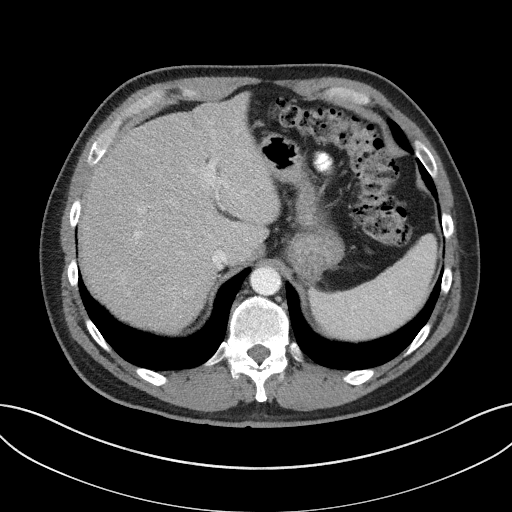
[im 102/108  soft-tissue]
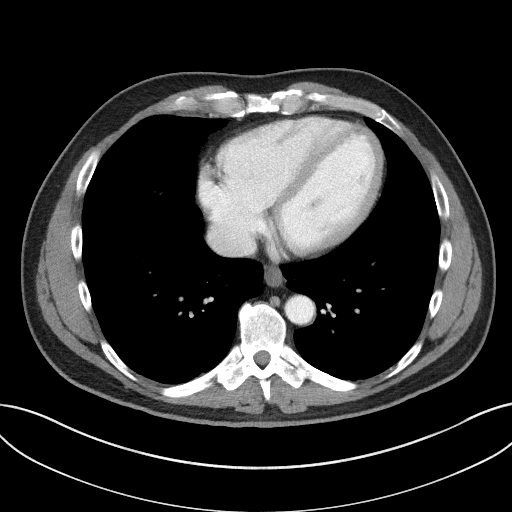

[Series 5: coronal st · coronal · 0.80mm/px · 3 of 97 slices shown]
[im 33/97  soft-tissue]
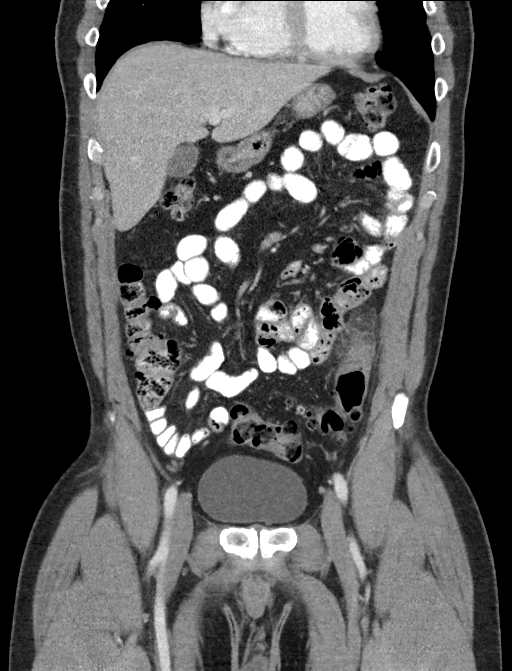
[im 43/97  soft-tissue]
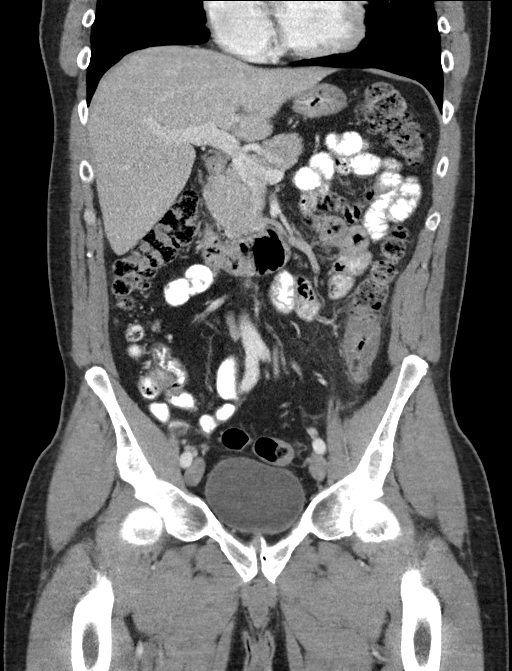
[im 54/97  soft-tissue]
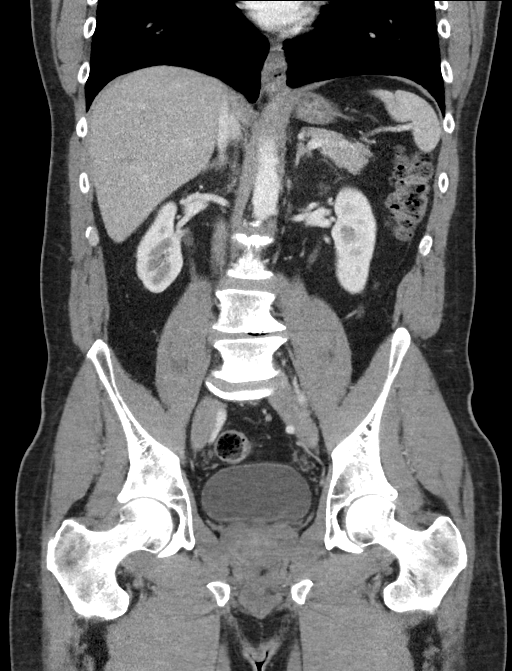

[15 of 46 positions shown; findings below may reference images not displayed]

RADIATION DOSE REDUCTION: This exam was performed according to the
departmental dose-optimization program which includes automated
exposure control, adjustment of the mA and/or kV according to
patient size and/or use of iterative reconstruction technique.

CONTRAST:  100mL OMNIPAQUE IOHEXOL 300 MG/ML  SOLN
FINDINGS: Lower chest: Minimal atelectasis is present at the lung bases. No
nodule or mass lesion is present. No significant pleural or
pericardial effusion is present. The heart size is normal.

Hepatobiliary: Mild fatty infiltration liver is noted. No discrete
lesions are present. The common bile duct and gallbladder are
normal.

Pancreas: Unremarkable. No pancreatic ductal dilatation or
surrounding inflammatory changes.

Spleen: Normal in size without focal abnormality.

Adrenals/Urinary Tract: Adrenal glands are normal bilaterally.
Kidneys and ureters are within normal limits. No stone or mass
lesion is present. No obstruction is with scratched at no
obstruction is present. The urinary bladder is within normal limits.

Stomach/Bowel: A small hiatal hernia is present. The stomach and
duodenum are otherwise within normal limits. Small bowel is
unremarkable. Terminal ileum is within normal limits. The appendix
is visualized and normal. The ascending and transverse colon are
within normal limits. Marked wall thickening is present in the
distal descending colon surrounding inflammatory changes. No free
air or free fluid is present. Additional diverticular changes are
present through the sigmoid colon without other areas of focal
inflammation. The rectum is within normal limits.

Vascular/Lymphatic: No significant vascular findings are present.

Reproductive: Prostate is unremarkable.

Other: No abdominal wall hernia or abnormality. No abdominopelvic
ascites.

Musculoskeletal: Degenerative changes in the lower lumbar spine with
possible stenosis at L4-5, left greater than right, and L5-S1, right
greater than left. No focal osseous lesions are present. Bony pelvis
is within limits. The hips are located and within limits.
IMPRESSION: 1. Marked wall thickening and surrounding inflammatory changes in
the distal descending colon compatible with acute
colitis/diverticulitis. No free air or free fluid is present.
2. Additional diverticular changes through the sigmoid colon without
other areas of focal inflammation.
3. Hepatic steatosis.
4. Degenerative changes in the lower lumbar spine with possible
stenosis at L4-5, left greater than right, and L5-S1, right greater
than left.

## 2024-03-06 ENCOUNTER — Ambulatory Visit: Payer: Self-pay

## 2024-03-06 DIAGNOSIS — K640 First degree hemorrhoids: Secondary | ICD-10-CM | POA: Diagnosis not present

## 2024-03-06 DIAGNOSIS — K5730 Diverticulosis of large intestine without perforation or abscess without bleeding: Secondary | ICD-10-CM | POA: Diagnosis not present

## 2024-03-06 DIAGNOSIS — Z1211 Encounter for screening for malignant neoplasm of colon: Secondary | ICD-10-CM | POA: Diagnosis present
